# Patient Record
Sex: Female | Born: 1985 | Race: Black or African American | Hispanic: No | Marital: Single | Smoking: Former smoker
Health system: Southern US, Community
[De-identification: ages and names within clinical notes are randomized; demographics above are authoritative.]

## PROBLEM LIST (undated history)

## (undated) DIAGNOSIS — O903 Peripartum cardiomyopathy: Secondary | ICD-10-CM

## (undated) HISTORY — PX: OTHER SURGICAL HISTORY: SHX169

## (undated) HISTORY — DX: Peripartum cardiomyopathy: O90.3

---

## 2014-07-11 LAB — OB RESULTS CONSOLE HIV ANTIBODY (ROUTINE TESTING): HIV: NONREACTIVE

## 2014-07-11 LAB — OB RESULTS CONSOLE GC/CHLAMYDIA
Chlamydia: NEGATIVE
Gonorrhea: NEGATIVE

## 2014-07-11 LAB — OB RESULTS CONSOLE RPR: RPR: NONREACTIVE

## 2014-07-11 LAB — OB RESULTS CONSOLE HEPATITIS B SURFACE ANTIGEN: HEP B S AG: NEGATIVE

## 2014-07-11 LAB — OB RESULTS CONSOLE ANTIBODY SCREEN: ANTIBODY SCREEN: NEGATIVE

## 2014-07-11 LAB — OB RESULTS CONSOLE RUBELLA ANTIBODY, IGM: Rubella: IMMUNE

## 2014-07-11 LAB — OB RESULTS CONSOLE ABO/RH: RH TYPE: POSITIVE

## 2014-07-24 ENCOUNTER — Ambulatory Visit (INDEPENDENT_AMBULATORY_CARE_PROVIDER_SITE_OTHER): Payer: BLUE CROSS/BLUE SHIELD | Admitting: Cardiology

## 2014-07-24 ENCOUNTER — Encounter: Payer: Self-pay | Admitting: Cardiology

## 2014-07-24 VITALS — BP 112/60 | HR 98 | Ht 65.0 in | Wt 282.0 lb

## 2014-07-24 DIAGNOSIS — Z331 Pregnant state, incidental: Secondary | ICD-10-CM

## 2014-07-24 DIAGNOSIS — O903 Peripartum cardiomyopathy: Secondary | ICD-10-CM | POA: Diagnosis not present

## 2014-07-24 DIAGNOSIS — Z3492 Encounter for supervision of normal pregnancy, unspecified, second trimester: Secondary | ICD-10-CM

## 2014-07-24 NOTE — Patient Instructions (Signed)
Medication Instructions:  Your physician recommends that you continue on your current medications as directed. Please refer to the Current Medication list given to you today.  Labwork: none  Testing/Procedures: Your physician has requested that you have an echocardiogram. Echocardiography is a painless test that uses sound waves to create images of your heart. It provides your doctor with information about the size and shape of your heart and how well your heart's chambers and valves are working. This procedure takes approximately one hour. There are no restrictions for this procedure.  Follow-Up: As needed     

## 2014-07-24 NOTE — Progress Notes (Signed)
Cardiology Office Note   Date:  07/24/2014   ID:  Mariah Friedman, DOB 07-11-1985, MRN [id number]  PCP:  No PCP Per Patient  Cardiologist: Cassell Clement MD  No chief complaint on file.     History of Present Illness: Mariah Friedman is a 29 y.o. female who presents for cardiology evaluation.  The patient is presently [redacted] weeks pregnant with her third pregnancy.  She is seen at the request of her obstetrician Dr. Henderson Cloud.  The patient has had 2 prior pregnancies, one 5 years ago and one 3 years ago.  She has to healthy children at home.  After each of her first 2 pregnancies the patient was diagnosed with brief postpartum cardiomyopathy.  The episode which occurred within a day or 2 of her first delivery caused her to be readmitted to the hospital with combined heart failure and pneumonia.  After her second pregnancy she was treated as an outpatient with a short course of diabetics.  So for during this present pregnancy the patient has been doing well.  She denies any chest pain or shortness of breath.  She has not been having any dizziness or syncope or palpitations.  He does have a history of a known heart murmur which she states that she has had since childhood.  She has had previous echocardiograms done in [place].  These are not currently accessible. Her family history reveals that her mother is living at age 65 and has asthma.  Her father is living at age 13 and is living and well.  There is no familial history of cardiomyopathy. The patient works as a Production designer, theatre/television/film at [employer].  She has worked for [employer] for 10 years.  She works the third shift from midnight until 8 AM The patient does not use alcohol or tobacco.    Past Medical History  Diagnosis Date  . Postpartum cardiomyopathy   . Amenorrhea   . Currently pregnant     Past Surgical History  Procedure Laterality Date  . Cesarean section  2011 2013     Current Outpatient Prescriptions  Medication Sig Dispense Refill  .  Prenatal Vit-Fe Fumarate-FA (MULTIVITAMIN-PRENATAL) 27-0.8 MG TABS tablet Take 1 tablet by mouth daily at 12 noon.     No current facility-administered medications for this visit.    Allergies:   Review of patient's allergies indicates no known allergies.    Social History:  The patient  reports that she has quit smoking. She does not have any smokeless tobacco history on file.   Family History:  The patient's family history includes Anemia in her father; Asthma in her mother; Thyroid disease in her maternal grandmother.    ROS:  Please see the history of present illness.   Otherwise, review of systems are positive for none.   All other systems are reviewed and negative.    PHYSICAL EXAM: VS:  BP 112/60 mmHg  Pulse 98  Ht  (1.651 m)  Wt 282 lb (127.914 kg)  BMI 46.93 kg/m2 , BMI Body mass index is 46.93 kg/(m^2). GEN: Well nourished, well developed, in no acute distress HEENT: normal Neck: no JVD, carotid bruits, or masses Cardiac: RRR; there is a soft systolic ejection murmur at the base. Respiratory:  clear to auscultation bilaterally, normal work of breathing GI: soft, nontender, nondistended, + BS MS: no deformity or atrophy Skin: warm and dry, no rash Neuro:  Strength and sensation are intact Psych: euthymic mood, full affect   EKG:  EKG is  ordered today. The ekg ordered today demonstrates normal sinus rhythm and no ischemic changes.   Recent Labs: No results found for requested labs within last 365 days.    Lipid Panel No results found for: CHOL, TRIG, HDL, CHOLHDL, VLDL, LDLCALC, LDLDIRECT    Wt Readings from Last 3 Encounters:  07/24/14 282 lb (127.914 kg)        ASSESSMENT AND PLAN:  1.  Second trimester pregnancy.  She is due on November 28 2.  Past history of previous brief postpartum cardiomyopathy symptoms following her first and second pregnancies 5 years ago and 3 years ago. 3.  Obesity 4.  Systolic heart murmur at base  Current  medicines are reviewed at length with the patient today.  The patient does not have concerns regarding medicines.  The following changes have been made:  no change  Labs/ tests ordered today include:  Orders Placed This Encounter  Procedures  . EKG 12-Lead  . ECHOCARDIOGRAM COMPLETE   My diagnostic impression is that at the present time the patient is not exhibiting any signs or symptoms of cardiomyopathy.  She does have a soft systolic ejection murmur at the base.  We will get an echocardiogram to assess her current left ventricular systolic function and to assess her heart murmur. She is on a prenatal multivitamin.  We did not add any other medications at this time. We will be ready to see her in the early postpartum period if she redevelops signs or symptoms of postpartum cardiomyopathy this time. Many thanks for the opportunity to see this pleasant woman with you.  We'll be in touch with you regarding the results of her echocardiogram.  Signed, Cassell Clementhomas Meela Wareing MD 07/24/2014 4:57 PM    [facility] [address], [place], [place]  [address] Phone: [phone]; Fax: [phone]

## 2014-08-02 ENCOUNTER — Other Ambulatory Visit: Payer: Self-pay

## 2014-08-02 ENCOUNTER — Ambulatory Visit (HOSPITAL_COMMUNITY): Payer: BLUE CROSS/BLUE SHIELD | Attending: Internal Medicine

## 2014-08-02 DIAGNOSIS — I371 Nonrheumatic pulmonary valve insufficiency: Secondary | ICD-10-CM | POA: Diagnosis not present

## 2014-08-02 DIAGNOSIS — O903 Peripartum cardiomyopathy: Secondary | ICD-10-CM | POA: Diagnosis present

## 2014-11-02 ENCOUNTER — Inpatient Hospital Stay (HOSPITAL_COMMUNITY)
Admission: AD | Admit: 2014-11-02 | Discharge: 2014-11-02 | Disposition: A | Discharge status: Home / Self Care | Payer: Medicaid Other | Source: Ambulatory Visit | Attending: Obstetrics and Gynecology | Admitting: Obstetrics and Gynecology

## 2014-11-02 ENCOUNTER — Encounter (HOSPITAL_COMMUNITY): Payer: Self-pay | Admitting: *Deleted

## 2014-11-02 ENCOUNTER — Inpatient Hospital Stay (HOSPITAL_COMMUNITY): Payer: Medicaid Other

## 2014-11-02 DIAGNOSIS — Z3A29 29 weeks gestation of pregnancy: Secondary | ICD-10-CM | POA: Diagnosis not present

## 2014-11-02 DIAGNOSIS — O26893 Other specified pregnancy related conditions, third trimester: Secondary | ICD-10-CM | POA: Diagnosis not present

## 2014-11-02 DIAGNOSIS — Z87891 Personal history of nicotine dependence: Secondary | ICD-10-CM | POA: Insufficient documentation

## 2014-11-02 DIAGNOSIS — R0602 Shortness of breath: Secondary | ICD-10-CM | POA: Diagnosis not present

## 2014-11-02 LAB — URINALYSIS, ROUTINE W REFLEX MICROSCOPIC
Bilirubin Urine: NEGATIVE
Glucose, UA: NEGATIVE mg/dL
KETONES UR: NEGATIVE mg/dL
Leukocytes, UA: NEGATIVE
NITRITE: NEGATIVE
PH: 7 (ref 5.0–8.0)
Protein, ur: NEGATIVE mg/dL
SPECIFIC GRAVITY, URINE: 1.015 (ref 1.005–1.030)
Urobilinogen, UA: 0.2 mg/dL (ref 0.0–1.0)

## 2014-11-02 LAB — URINE MICROSCOPIC-ADD ON

## 2014-11-02 NOTE — Discharge Instructions (Signed)
Shortness of Breath °Shortness of breath means you have trouble breathing. Shortness of breath needs medical care right away. °HOME CARE  °· Do not smoke. °· Avoid being around chemicals or things (paint fumes, dust) that may bother your breathing. °· Rest as needed. Slowly begin your normal activities. °· Only take medicines as told by your doctor. °· Keep all doctor visits as told. °GET HELP RIGHT AWAY IF:  °· Your shortness of breath gets worse. °· You feel lightheaded, pass out (faint), or have a cough that is not helped by medicine. °· You cough up blood. °· You have pain with breathing. °· You have pain in your chest, arms, shoulders, or belly (abdomen). °· You have a fever. °· You cannot walk up stairs or exercise the way you normally do. °· You do not get better in the time expected. °· You have a hard time doing normal activities even with rest. °· You have problems with your medicines. °· You have any new symptoms. °MAKE SURE YOU: °· Understand these instructions. °· Will watch your condition. °· Will get help right away if you are not doing well or get worse. °Document Released: 07/23/2007 Document Revised: 02/08/2013 Document Reviewed: 04/21/2011 °ExitCare® Patient Information ©2015 [employer]. This information is not intended to replace advice given to you by your health care provider. Make sure you discuss any questions you have with your health care provider. ° °

## 2014-11-02 NOTE — MAU Provider Note (Signed)
History     CSN: [id number]  Arrival date and time: 11/02/14 1045   First Provider Initiated Contact with Patient 11/02/14 1128      No chief complaint on file.  HPI  Mariah Friedman 29 y.o. [id number] [redacted]w[redacted]d presents to the MAU from the office with SOB. She has a history of cardiomyopathy.  Past Medical History  Diagnosis Date  . Postpartum cardiomyopathy   . Amenorrhea   . Currently pregnant     Past Surgical History  Procedure Laterality Date  . Cesarean section  2011 2013    Family History  Problem Relation Age of Onset  . Asthma Mother   . Anemia Father   . Thyroid disease Maternal Grandmother     Social History  Substance Use Topics  . Smoking status: Former Games developer  . Smokeless tobacco: None  . Alcohol Use: None    Allergies: No Known Allergies  Prescriptions prior to admission  Medication Sig Dispense Refill Last Dose  . ferrous fumarate (HEMOCYTE - 106 MG FE) 325 (106 FE) MG TABS tablet Take 1 tablet by mouth.     . Prenatal Vit-Fe Fumarate-FA (MULTIVITAMIN-PRENATAL) 27-0.8 MG TABS tablet Take 1 tablet by mouth daily at 12 noon.   11/02/2014 at Unknown time    Review of Systems  Constitutional: Negative for fever.  Respiratory: Positive for shortness of breath.   Cardiovascular: Positive for leg swelling.  All other systems reviewed and are negative.  Physical Exam   Blood pressure 122/60, pulse 92, temperature 98.3 F (36.8 C), temperature source Oral, resp. rate 16, height 5\' 5"  (1.651 m), weight 132.507 kg (292 lb 2 oz), SpO2 97 %.  11/02/14 1230  --  92  --  --  122/60 mmHg  --  97 %  --  -- EM    11/02/14 1216  --  93  --  --  128/60 mmHg  --  98 %  --  -- EM   11/02/14 1145  --  103  --  --  141/70 mmHg  --  93 %  --  -- EM   11/02/14 1130  --  95  --  --  129/72 mmHg  --  95 %  --  -- EM   11/02/14 1120  --  99  --  --  --  --  99 %  --  -- EM   11/02/14 1115  --  101  --  --  130/68 mmHg  --  93 %  --  -- EM   11/02/14 1112  --  --  --   --  --  --  --  --  132.507 kg (292 lb 2 oz) EM   11/02/14 1111  --  109  --  --  118/74 mmHg  --  100 %  Room Air  -- EM   11/02/14 1107  98.3 F (36.8 C)  --  --  16  --  --  --  --  -- EM          Physical Exam  Nursing note and vitals reviewed. Constitutional: She is oriented to person, place, and time. She appears well-developed and well-nourished. No distress.  HENT:  Head: Normocephalic and atraumatic.  Cardiovascular: Normal rate.   Respiratory: Effort normal and breath sounds normal. No respiratory distress.  GI: Soft. There is no tenderness.  Musculoskeletal: Normal range of motion. She exhibits edema.  Bilateral LE  Neurological: She is alert and oriented to person, place,  and time.  Skin: Skin is warm and dry.  Psychiatric: She has a normal mood and affect. Her behavior is normal. Judgment and thought content normal.   Results for orders placed or performed during the hospital encounter of 11/02/14 (from the past 24 hour(s))  Urinalysis, Routine w reflex microscopic (not at [facility])     Status: Abnormal   Collection Time: 11/02/14 10:50 AM  Result Value Ref Range   Color, Urine YELLOW YELLOW   APPearance CLEAR CLEAR   Specific Gravity, Urine 1.015 1.005 - 1.030   pH 7.0 5.0 - 8.0   Glucose, UA NEGATIVE NEGATIVE mg/dL   Hgb urine dipstick LARGE (A) NEGATIVE   Bilirubin Urine NEGATIVE NEGATIVE   Ketones, ur NEGATIVE NEGATIVE mg/dL   Protein, ur NEGATIVE NEGATIVE mg/dL   Urobilinogen, UA 0.2 0.0 - 1.0 mg/dL   Nitrite NEGATIVE NEGATIVE   Leukocytes, UA NEGATIVE NEGATIVE  Urine microscopic-add on     Status: None   Collection Time: 11/02/14 10:50 AM  Result Value Ref Range   Squamous Epithelial / LPF RARE RARE   RBC / HPF 21-50 <3 RBC/hpf   DATA: Shortness of breath for 2 weeks. Patient is pregnant.  EXAM: CHEST 2 VIEW  COMPARISON: None.  FINDINGS: The heart size and mediastinal contours are within normal limits. Both lungs are clear. The  visualized skeletal structures are unremarkable.  IMPRESSION: No active cardiopulmonary disease.   Electronically Signed  By: Annia Belt M.D.  On: 11/02/2014 12:24  [facility] PED ROUTINE RECORD Sinus tachycardia Possible Left atrial enlargement Nonspecific T wave abnormality Abnormal ECG 109mm/s 58mm/mV  8.0 SP2 12SL 241 HD CID: [id number] Referred by: Weber Monnier Unconfirmed Vent. rate 103 BPM PR interval 156 ms QRS duration 74 ms  FHT- 150 Cat 1 No contractions Sat- 100% MAU Course  Procedures  MDM  Ekg reviewed by cardiologist on call. Reported results to Dr Henderson Cloud. Pt is currently not having any symptoms, SOB. She will be discharged to home Assessment and Plan  SOB Discharge to home  [facility] 11/02/2014, 12:45 PM

## 2014-11-02 NOTE — MAU Note (Signed)
Pt. Was sent from primary OB office due to complaints of increased swelling, SOB and feeling dizzy. Pt. [place] the beach around [place] end of [place] and noticed the swelling since then. Pt. States feels baby movement. Denies LOF or bleeding. Here for evaluation.

## 2014-11-02 NOTE — MAU Note (Signed)
Respiratory called about EKG order and will be down shortly.

## 2014-11-02 NOTE — MAU Note (Signed)
Respiratory here for EKG. Radiology paged.

## 2014-12-22 ENCOUNTER — Inpatient Hospital Stay (HOSPITAL_COMMUNITY)
Admission: AD | Admit: 2014-12-22 | Discharge: 2014-12-22 | Disposition: A | Discharge status: Home / Self Care | Payer: Medicaid Other | Source: Ambulatory Visit | Attending: Obstetrics and Gynecology | Admitting: Obstetrics and Gynecology

## 2014-12-22 ENCOUNTER — Encounter (HOSPITAL_COMMUNITY): Payer: Self-pay | Admitting: *Deleted

## 2014-12-22 DIAGNOSIS — R600 Localized edema: Secondary | ICD-10-CM | POA: Diagnosis not present

## 2014-12-22 DIAGNOSIS — O26893 Other specified pregnancy related conditions, third trimester: Secondary | ICD-10-CM | POA: Insufficient documentation

## 2014-12-22 DIAGNOSIS — Z3A36 36 weeks gestation of pregnancy: Secondary | ICD-10-CM | POA: Diagnosis not present

## 2014-12-22 DIAGNOSIS — R030 Elevated blood-pressure reading, without diagnosis of hypertension: Secondary | ICD-10-CM | POA: Diagnosis not present

## 2014-12-22 DIAGNOSIS — IMO0001 Reserved for inherently not codable concepts without codable children: Secondary | ICD-10-CM

## 2014-12-22 DIAGNOSIS — O163 Unspecified maternal hypertension, third trimester: Secondary | ICD-10-CM | POA: Diagnosis not present

## 2014-12-22 LAB — COMPREHENSIVE METABOLIC PANEL
ALBUMIN: 2.5 g/dL — AB (ref 3.5–5.0)
ALK PHOS: 106 U/L (ref 38–126)
ALT: 15 U/L (ref 14–54)
AST: 18 U/L (ref 15–41)
Anion gap: 8 (ref 5–15)
BILIRUBIN TOTAL: 0.4 mg/dL (ref 0.3–1.2)
CALCIUM: 9.2 mg/dL (ref 8.9–10.3)
CO2: 23 mmol/L (ref 22–32)
CREATININE: 0.51 mg/dL (ref 0.44–1.00)
Chloride: 102 mmol/L (ref 101–111)
GFR calc Af Amer: >60 mL/min (ref >60)
GLUCOSE: 88 mg/dL (ref 65–99)
Potassium: 4 mmol/L (ref 3.5–5.1)
Sodium: 133 mmol/L — ABNORMAL LOW (ref 135–145)
TOTAL PROTEIN: 6.4 g/dL — AB (ref 6.5–8.1)

## 2014-12-22 LAB — CBC WITH DIFFERENTIAL/PLATELET
BASOS PCT: 0 %
Basophils Absolute: 0 10*3/uL (ref 0.0–0.1)
Eosinophils Absolute: 0.1 10*3/uL (ref 0.0–0.7)
Eosinophils Relative: 1 %
HEMATOCRIT: 34.3 % — AB (ref 36.0–46.0)
HEMOGLOBIN: 10.7 g/dL — AB (ref 12.0–15.0)
LYMPHS PCT: 24 %
Lymphs Abs: 1.6 10*3/uL (ref 0.7–4.0)
MCH: 23.9 pg — ABNORMAL LOW (ref 26.0–34.0)
MCHC: 31.2 g/dL (ref 30.0–36.0)
MCV: 76.6 fL — AB (ref 78.0–100.0)
MONO ABS: 0.5 10*3/uL (ref 0.1–1.0)
MONOS PCT: 7 %
NEUTROS ABS: 4.7 10*3/uL (ref 1.7–7.7)
NEUTROS PCT: 69 %
Platelets: 251 10*3/uL (ref 150–400)
RBC: 4.48 MIL/uL (ref 3.87–5.11)
RDW: 19.3 % — ABNORMAL HIGH (ref 11.5–15.5)
WBC: 6.8 10*3/uL (ref 4.0–10.5)

## 2014-12-22 LAB — URINALYSIS, ROUTINE W REFLEX MICROSCOPIC
Bilirubin Urine: NEGATIVE
GLUCOSE, UA: NEGATIVE mg/dL
Ketones, ur: NEGATIVE mg/dL
Nitrite: NEGATIVE
PH: 6.5 (ref 5.0–8.0)
PROTEIN: NEGATIVE mg/dL
SPECIFIC GRAVITY, URINE: 1.01 (ref 1.005–1.030)
Urobilinogen, UA: 0.2 mg/dL (ref 0.0–1.0)

## 2014-12-22 LAB — PROTEIN / CREATININE RATIO, URINE
CREATININE, URINE: 63 mg/dL
Protein Creatinine Ratio: 0.11 mg/mg{Cre} (ref 0.00–0.15)
Total Protein, Urine: 7 mg/dL

## 2014-12-22 LAB — URINE MICROSCOPIC-ADD ON

## 2014-12-22 NOTE — MAU Note (Signed)
Pt. Sent from office for elevated b/p. Denies Headache or visual changes.

## 2014-12-22 NOTE — MAU Provider Note (Signed)
History     CSN: [id number]  Arrival date and time: 12/22/14 1644   None     Chief Complaint  Patient presents with  . Hypertension   HPI  Ms. Drema PryShawnqua Barbara Cowerapier is a 29 y.o. G3P2002 at 7965w4d who presents to MAU today from the office for further evaluation of slightly elevated blood pressures. The patient has a history of PP cardiomyopathy and has been cleared by [facility]. She states recent slightly elevated BPs in the office including today. She denies headache, blurred vision, abdominal pain, contractions, vaginal bleeding or LOF. She does endorse a small amount of lower extremity edema. She reports good fetal movement.   OB History    Gravida Para Term Preterm AB TAB SAB Ectopic Multiple Living   3 2 2       2       Past Medical History  Diagnosis Date  . Postpartum cardiomyopathy   . Amenorrhea   . Currently pregnant     Past Surgical History  Procedure Laterality Date  . Cesarean section  2011 2013    Family History  Problem Relation Age of Onset  . Asthma Mother   . Anemia Father   . Thyroid disease Maternal Grandmother     Social History  Substance Use Topics  . Smoking status: Former Games developermoker  . Smokeless tobacco: None  . Alcohol Use: None    Allergies: No Known Allergies  No prescriptions prior to admission    Review of Systems  Constitutional: Negative for fever and malaise/fatigue.  Eyes: Negative for blurred vision.  Cardiovascular: Negative for leg swelling.  Gastrointestinal: Negative for abdominal pain.  Neurological: Negative for headaches.   Physical Exam   Blood pressure 124/70, pulse 86.  Physical Exam  Nursing note and vitals reviewed. Constitutional: She is oriented to person, place, and time. She appears well-developed and well-nourished. No distress.  HENT:  Head: Normocephalic and atraumatic.  Cardiovascular: Normal rate.   Respiratory: Effort normal.  GI: Soft. She exhibits no distension and no mass. There is tenderness (mild  RUQ abdominal tenderness to palpation). There is no rebound and no guarding.  Musculoskeletal: She exhibits no edema.  Neurological: She is alert and oriented to person, place, and time. She has normal reflexes.  No clonus  Skin: Skin is warm and dry. No erythema.  Psychiatric: She has a normal mood and affect.   Patient Vitals for the past 24 hrs:  BP Pulse  12/22/14 1801 124/70 mmHg 86  12/22/14 1746 128/55 mmHg 86  12/22/14 1731 112/76 mmHg 101  12/22/14 1718 132/64 mmHg 88   Results for orders placed or performed during the hospital encounter of 12/22/14 (from the past 24 hour(s))  Protein / creatinine ratio, urine     Status: None   Collection Time: 12/22/14  5:00 PM  Result Value Ref Range   Creatinine, Urine 63.00 mg/dL   Total Protein, Urine 7 mg/dL   Protein Creatinine Ratio 0.11 0.00 - 0.15 mg/mg[Cre]  Urinalysis, Routine w reflex microscopic (not at [facility])     Status: Abnormal   Collection Time: 12/22/14  5:00 PM  Result Value Ref Range   Color, Urine YELLOW YELLOW   APPearance CLEAR CLEAR   Specific Gravity, Urine 1.010 1.005 - 1.030   pH 6.5 5.0 - 8.0   Glucose, UA NEGATIVE NEGATIVE mg/dL   Hgb urine dipstick SMALL (A) NEGATIVE   Bilirubin Urine NEGATIVE NEGATIVE   Ketones, ur NEGATIVE NEGATIVE mg/dL   Protein, ur NEGATIVE NEGATIVE mg/dL  Urobilinogen, UA 0.2 0.0 - 1.0 mg/dL   Nitrite NEGATIVE NEGATIVE   Leukocytes, UA TRACE (A) NEGATIVE  Urine microscopic-add on     Status: Abnormal   Collection Time: 12/22/14  5:00 PM  Result Value Ref Range   Squamous Epithelial / LPF FEW (A) RARE   WBC, UA 0-2 <3 WBC/hpf   RBC / HPF 0-2 <3 RBC/hpf  CBC with Differential/Platelet     Status: Abnormal   Collection Time: 12/22/14  5:16 PM  Result Value Ref Range   WBC 6.8 4.0 - 10.5 K/uL   RBC 4.48 3.87 - 5.11 MIL/uL   Hemoglobin 10.7 (L) 12.0 - 15.0 g/dL   HCT 16.1 (L) 09.6 - 04.5 %   MCV 76.6 (L) 78.0 - 100.0 fL   MCH 23.9 (L) 26.0 - 34.0 pg   MCHC 31.2 30.0 - 36.0  g/dL   RDW 40.9 (H) 81.1 - 91.4 %   Platelets 251 150 - 400 K/uL   Neutrophils Relative % 69 %   Neutro Abs 4.7 1.7 - 7.7 K/uL   Lymphocytes Relative 24 %   Lymphs Abs 1.6 0.7 - 4.0 K/uL   Monocytes Relative 7 %   Monocytes Absolute 0.5 0.1 - 1.0 K/uL   Eosinophils Relative 1 %   Eosinophils Absolute 0.1 0.0 - 0.7 K/uL   Basophils Relative 0 %   Basophils Absolute 0.0 0.0 - 0.1 K/uL  Comprehensive metabolic panel     Status: Abnormal   Collection Time: 12/22/14  5:16 PM  Result Value Ref Range   Sodium 133 (L) 135 - 145 mmol/L   Potassium 4.0 3.5 - 5.1 mmol/L   Chloride 102 101 - 111 mmol/L   CO2 23 22 - 32 mmol/L   Glucose, Bld 88 65 - 99 mg/dL   BUN <5 (L) 6 - 20 mg/dL   Creatinine, Ser 7.82 0.44 - 1.00 mg/dL   Calcium 9.2 8.9 - 95.6 mg/dL   Total Protein 6.4 (L) 6.5 - 8.1 g/dL   Albumin 2.5 (L) 3.5 - 5.0 g/dL   AST 18 15 - 41 U/L   ALT 15 14 - 54 U/L   Alkaline Phosphatase 106 38 - 126 U/L   Total Bilirubin 0.4 0.3 - 1.2 mg/dL   GFR calc non Af Amer >60 >60 mL/min   GFR calc Af Amer >60 >60 mL/min   Anion gap 8 5 - 15    Fetal Monitoring: Baseline: 140 bpm, moderate variability, + accelerations, no decelerations Contractions: none  MAU Course  Procedures None  MDM UA, Urine protein/creatinine ratio, CBC and CMP today Discussed with Dr. Henderson Cloud. Ok for discharge at this time with Pre-eclampsia precautions.   Assessment and Plan  A: [facility] at [redacted]w[redacted]d History of PP cardiomyopathy Slightly elevated blood pressure  P: Discharge home Labor precautions and warning signs for pre-eclampsia discussed Patient advised to follow-up with [facility] as scheduled or sooner if symptoms change or worsen Patient may return to MAU as needed or if her condition were to change or worsen  Marny Lowenstein, PA-C  12/22/2014, 7:30 PM

## 2014-12-22 NOTE — Discharge Instructions (Signed)

## 2015-01-01 ENCOUNTER — Encounter (HOSPITAL_COMMUNITY): Payer: Self-pay | Admitting: Obstetrics

## 2015-01-01 ENCOUNTER — Other Ambulatory Visit: Payer: Self-pay | Admitting: Obstetrics

## 2015-01-05 ENCOUNTER — Encounter (INDEPENDENT_AMBULATORY_CARE_PROVIDER_SITE_OTHER): Payer: Self-pay

## 2015-01-05 ENCOUNTER — Encounter (HOSPITAL_COMMUNITY): Payer: Self-pay

## 2015-01-05 ENCOUNTER — Encounter (HOSPITAL_COMMUNITY)
Admission: RE | Admit: 2015-01-05 | Discharge: 2015-01-05 | Disposition: A | Discharge status: Home / Self Care | Payer: Medicaid Other | Source: Ambulatory Visit | Attending: Obstetrics | Admitting: Obstetrics

## 2015-01-05 DIAGNOSIS — Z01818 Encounter for other preprocedural examination: Secondary | ICD-10-CM | POA: Diagnosis present

## 2015-01-05 LAB — CBC
HCT: 34.6 % — ABNORMAL LOW (ref 36.0–46.0)
HEMOGLOBIN: 10.9 g/dL — AB (ref 12.0–15.0)
MCH: 24.1 pg — AB (ref 26.0–34.0)
MCHC: 31.5 g/dL (ref 30.0–36.0)
MCV: 76.5 fL — ABNORMAL LOW (ref 78.0–100.0)
PLATELETS: 303 10*3/uL (ref 150–400)
RBC: 4.52 MIL/uL (ref 3.87–5.11)
RDW: 19.4 % — AB (ref 11.5–15.5)
WBC: 6.3 10*3/uL (ref 4.0–10.5)

## 2015-01-05 LAB — ABO/RH: ABO/RH(D): O POS

## 2015-01-05 NOTE — Anesthesia Preprocedure Evaluation (Addendum)
Anesthesia Evaluation  Patient identified by MRN, date of birth, ID band Patient awake    Reviewed: Allergy & Precautions, NPO status , Patient's Chart, lab work & pertinent test results  Airway Mallampati: II   Neck ROM: Full    Dental  (+) Loose, Dental Advisory Given,    Pulmonary former smoker,    breath sounds clear to auscultation       Cardiovascular  Rhythm:Regular  EKG 9/15 sinus tach 103, ECHO 08/02/14 EF 65%, has HX of PP cardiomyopathy, has been seen by cardiology this Pregnancy.  CM developed shortly after delivery of previous two births and required diuretic RX   Neuro/Psych negative neurological ROS  negative psych ROS   GI/Hepatic negative GI ROS, Neg liver ROS,   Endo/Other  negative endocrine ROSMorbid obesity  Renal/GU negative Renal ROS  negative genitourinary   Musculoskeletal negative musculoskeletal ROS (+)   Abdominal (+) + obese,   Peds  Hematology 10/35, plts 303   Anesthesia Other Findings   Reproductive/Obstetrics HX PP cardiomyopathy                           Anesthesia Physical Anesthesia Plan  ASA: III  Anesthesia Plan: Spinal   Post-op Pain Management:    Induction:   Airway Management Planned: Natural Airway  Additional Equipment:   Intra-op Plan:   Post-operative Plan:   Informed Consent: I have reviewed the patients History and Physical, chart, labs and discussed the procedure including the risks, benefits and alternatives for the proposed anesthesia with the patient or authorized representative who has indicated his/her understanding and acceptance.     Plan Discussed with:   Anesthesia Plan Comments: (HX PP Cardiomyopathy requiring diuretics, developed after both prior deliveries shortly after delivery.  Watch fluids, has normal ECHO this June, cardiology following patient)        Anesthesia Quick Evaluation

## 2015-01-05 NOTE — Patient Instructions (Signed)
Your procedure is scheduled on:  January 08, 2015  Enter through the Main Entrance of [facility] at: 6:00 am   Pick up the phone at the desk and dial [phone].  Call this number if you have problems the morning of surgery: [phone].  Remember: Do NOT eat food: after midnight on Sunday Do NOT drink clear liquids after: after midnight on Sunday  Take these medicines the morning of surgery with a SIP OF WATER:  None   Do NOT wear jewelry (body piercing), metal hair clips/bobby pins,or nail polish. Do NOT wear lotions, powders, or perfumes.  You may wear deoderant. Do NOT shave for 48 hours prior to surgery. Do NOT bring valuables to the hospital. Leave suitcase in car.  After surgery it may be brought to your room.  For patients admitted to the hospital, checkout time is 11:00 AM the day of discharge.

## 2015-01-06 LAB — RPR: RPR Ser Ql: NONREACTIVE

## 2015-01-07 MED ORDER — DEXTROSE 5 % IV SOLN
3.0000 g | INTRAVENOUS | Status: AC
  Administered 2015-01-08: 3 g via INTRAVENOUS
  Filled 2015-01-07: qty 3000

## 2015-01-08 ENCOUNTER — Inpatient Hospital Stay (HOSPITAL_COMMUNITY)
Admission: AD | Admit: 2015-01-08 | Discharge: 2015-01-10 | DRG: 765 | Disposition: A | Discharge status: Home / Self Care | Payer: Medicaid Other | Source: Ambulatory Visit | Attending: Obstetrics | Admitting: Obstetrics

## 2015-01-08 ENCOUNTER — Inpatient Hospital Stay (HOSPITAL_COMMUNITY): Payer: Medicaid Other | Admitting: Anesthesiology

## 2015-01-08 ENCOUNTER — Encounter (HOSPITAL_COMMUNITY): Payer: Self-pay | Admitting: *Deleted

## 2015-01-08 ENCOUNTER — Encounter (HOSPITAL_COMMUNITY)
Admission: AD | Disposition: A | Discharge status: Home / Self Care | Payer: Self-pay | Source: Ambulatory Visit | Attending: Obstetrics

## 2015-01-08 DIAGNOSIS — O4103X0 Oligohydramnios, third trimester, not applicable or unspecified: Secondary | ICD-10-CM | POA: Diagnosis present

## 2015-01-08 DIAGNOSIS — Z87891 Personal history of nicotine dependence: Secondary | ICD-10-CM | POA: Diagnosis not present

## 2015-01-08 DIAGNOSIS — O321XX0 Maternal care for breech presentation, not applicable or unspecified: Secondary | ICD-10-CM | POA: Diagnosis present

## 2015-01-08 DIAGNOSIS — Z3490 Encounter for supervision of normal pregnancy, unspecified, unspecified trimester: Secondary | ICD-10-CM

## 2015-01-08 DIAGNOSIS — O34219 Maternal care for unspecified type scar from previous cesarean delivery: Secondary | ICD-10-CM | POA: Diagnosis present

## 2015-01-08 DIAGNOSIS — Z8679 Personal history of other diseases of the circulatory system: Secondary | ICD-10-CM | POA: Diagnosis not present

## 2015-01-08 DIAGNOSIS — Z302 Encounter for sterilization: Secondary | ICD-10-CM | POA: Diagnosis not present

## 2015-01-08 DIAGNOSIS — Z3A39 39 weeks gestation of pregnancy: Secondary | ICD-10-CM | POA: Diagnosis not present

## 2015-01-08 DIAGNOSIS — Z6841 Body Mass Index (BMI) 40.0 and over, adult: Secondary | ICD-10-CM

## 2015-01-08 DIAGNOSIS — O99214 Obesity complicating childbirth: Secondary | ICD-10-CM | POA: Diagnosis present

## 2015-01-08 DIAGNOSIS — O34211 Maternal care for low transverse scar from previous cesarean delivery: Principal | ICD-10-CM | POA: Diagnosis present

## 2015-01-08 LAB — PREPARE RBC (CROSSMATCH)

## 2015-01-08 SURGERY — Surgical Case
Anesthesia: Spinal

## 2015-01-08 MED ORDER — NALBUPHINE HCL 10 MG/ML IJ SOLN: 5.0000 mg | INTRAMUSCULAR | Status: DC | PRN

## 2015-01-08 MED ORDER — FENTANYL CITRATE (PF) 100 MCG/2ML IJ SOLN
INTRAMUSCULAR | Status: AC
  Administered 2015-01-08: 50 ug via INTRAVENOUS
  Filled 2015-01-08: qty 2

## 2015-01-08 MED ORDER — OXYCODONE-ACETAMINOPHEN 5-325 MG PO TABS
1.0000 | ORAL_TABLET | ORAL | Status: DC | PRN
  Administered 2015-01-09 – 2015-01-10 (×3): 1 via ORAL
  Filled 2015-01-08 (×2): qty 1

## 2015-01-08 MED ORDER — OXYCODONE-ACETAMINOPHEN 5-325 MG PO TABS: 2.0000 | ORAL_TABLET | ORAL | Status: DC | PRN

## 2015-01-08 MED ORDER — SODIUM CHLORIDE 0.9 % IJ SOLN: 3.0000 mL | INTRAMUSCULAR | Status: DC | PRN

## 2015-01-08 MED ORDER — LACTATED RINGERS IV SOLN
INTRAVENOUS | Status: DC | PRN
  Administered 2015-01-08 (×2): via INTRAVENOUS

## 2015-01-08 MED ORDER — SCOPOLAMINE 1 MG/3DAYS TD PT72
MEDICATED_PATCH | TRANSDERMAL | Status: AC
  Administered 2015-01-08: 1.5 mg via TRANSDERMAL
  Filled 2015-01-08: qty 1

## 2015-01-08 MED ORDER — FENTANYL CITRATE (PF) 100 MCG/2ML IJ SOLN
INTRAMUSCULAR | Status: AC
  Filled 2015-01-08: qty 2

## 2015-01-08 MED ORDER — MEPERIDINE HCL 25 MG/ML IJ SOLN: 6.2500 mg | INTRAMUSCULAR | Status: DC | PRN

## 2015-01-08 MED ORDER — EPHEDRINE 5 MG/ML INJ
INTRAVENOUS | Status: AC
  Filled 2015-01-08: qty 10

## 2015-01-08 MED ORDER — SCOPOLAMINE 1 MG/3DAYS TD PT72
1.0000 | MEDICATED_PATCH | Freq: Once | TRANSDERMAL | Status: DC
  Administered 2015-01-08: 1.5 mg via TRANSDERMAL

## 2015-01-08 MED ORDER — PRENATAL MULTIVITAMIN CH
1.0000 | ORAL_TABLET | Freq: Every day | ORAL | Status: DC
  Administered 2015-01-09 – 2015-01-10 (×2): 1 via ORAL
  Filled 2015-01-08 (×2): qty 1

## 2015-01-08 MED ORDER — NALOXONE HCL 0.4 MG/ML IJ SOLN: 0.4000 mg | INTRAMUSCULAR | Status: DC | PRN

## 2015-01-08 MED ORDER — CEFAZOLIN SODIUM-DEXTROSE 2-3 GM-% IV SOLR
INTRAVENOUS | Status: AC
  Filled 2015-01-08: qty 50

## 2015-01-08 MED ORDER — PROMETHAZINE HCL 25 MG/ML IJ SOLN: 6.2500 mg | INTRAMUSCULAR | Status: DC | PRN

## 2015-01-08 MED ORDER — SIMETHICONE 80 MG PO CHEW
80.0000 mg | CHEWABLE_TABLET | Freq: Three times a day (TID) | ORAL | Status: DC
  Administered 2015-01-09 – 2015-01-10 (×4): 80 mg via ORAL
  Filled 2015-01-08 (×4): qty 1

## 2015-01-08 MED ORDER — DIPHENHYDRAMINE HCL 25 MG PO CAPS: 25.0000 mg | ORAL_CAPSULE | Freq: Four times a day (QID) | ORAL | Status: DC | PRN

## 2015-01-08 MED ORDER — OXYTOCIN 10 UNIT/ML IJ SOLN
INTRAMUSCULAR | Status: AC
  Filled 2015-01-08: qty 4

## 2015-01-08 MED ORDER — OXYTOCIN 40 UNITS IN LACTATED RINGERS INFUSION - SIMPLE MED: 62.5000 mL/h | INTRAVENOUS | Status: AC

## 2015-01-08 MED ORDER — FENTANYL CITRATE (PF) 100 MCG/2ML IJ SOLN
INTRAMUSCULAR | Status: DC | PRN
  Administered 2015-01-08 (×2): 25 ug via INTRAVENOUS
  Administered 2015-01-08: 15 ug via INTRAVENOUS
  Administered 2015-01-08: 25 ug via INTRAVENOUS

## 2015-01-08 MED ORDER — NALBUPHINE HCL 10 MG/ML IJ SOLN: 5.0000 mg | Freq: Once | INTRAMUSCULAR | Status: DC | PRN

## 2015-01-08 MED ORDER — SCOPOLAMINE 1 MG/3DAYS TD PT72: 1.0000 | MEDICATED_PATCH | Freq: Once | TRANSDERMAL | Status: DC

## 2015-01-08 MED ORDER — DIPHENHYDRAMINE HCL 25 MG PO CAPS
25.0000 mg | ORAL_CAPSULE | ORAL | Status: DC | PRN
  Filled 2015-01-08: qty 1

## 2015-01-08 MED ORDER — ONDANSETRON HCL 4 MG/2ML IJ SOLN
INTRAMUSCULAR | Status: DC | PRN
  Administered 2015-01-08: 4 mg via INTRAVENOUS

## 2015-01-08 MED ORDER — MENTHOL 3 MG MT LOZG: 1.0000 | LOZENGE | OROMUCOSAL | Status: DC | PRN

## 2015-01-08 MED ORDER — MORPHINE SULFATE (PF) 0.5 MG/ML IJ SOLN
INTRAMUSCULAR | Status: AC
  Filled 2015-01-08: qty 10

## 2015-01-08 MED ORDER — KETOROLAC TROMETHAMINE 30 MG/ML IJ SOLN
INTRAMUSCULAR | Status: AC
  Administered 2015-01-08: 30 mg via INTRAMUSCULAR
  Filled 2015-01-08: qty 1

## 2015-01-08 MED ORDER — PHENYLEPHRINE 8 MG IN D5W 100 ML (0.08MG/ML) PREMIX OPTIME
INJECTION | INTRAVENOUS | Status: AC
  Filled 2015-01-08: qty 100

## 2015-01-08 MED ORDER — LACTATED RINGERS IV SOLN
Freq: Once | INTRAVENOUS | Status: AC
  Administered 2015-01-08: 07:00:00 via INTRAVENOUS

## 2015-01-08 MED ORDER — BUPIVACAINE IN DEXTROSE 0.75-8.25 % IT SOLN
INTRATHECAL | Status: AC
  Filled 2015-01-08: qty 2

## 2015-01-08 MED ORDER — SIMETHICONE 80 MG PO CHEW
80.0000 mg | CHEWABLE_TABLET | ORAL | Status: DC
  Administered 2015-01-09 – 2015-01-10 (×2): 80 mg via ORAL
  Filled 2015-01-08 (×2): qty 1

## 2015-01-08 MED ORDER — DIBUCAINE 1 % RE OINT: 1.0000 "application " | TOPICAL_OINTMENT | RECTAL | Status: DC | PRN

## 2015-01-08 MED ORDER — WITCH HAZEL-GLYCERIN EX PADS: 1.0000 "application " | MEDICATED_PAD | CUTANEOUS | Status: DC | PRN

## 2015-01-08 MED ORDER — ONDANSETRON HCL 4 MG/2ML IJ SOLN: 4.0000 mg | Freq: Three times a day (TID) | INTRAMUSCULAR | Status: DC | PRN

## 2015-01-08 MED ORDER — DIPHENHYDRAMINE HCL 50 MG/ML IJ SOLN: 12.5000 mg | INTRAMUSCULAR | Status: DC | PRN

## 2015-01-08 MED ORDER — KETOROLAC TROMETHAMINE 30 MG/ML IJ SOLN: 30.0000 mg | Freq: Four times a day (QID) | INTRAMUSCULAR | Status: DC | PRN

## 2015-01-08 MED ORDER — PHENYLEPHRINE HCL 10 MG/ML IJ SOLN
INTRAMUSCULAR | Status: DC | PRN
Start: 2015-01-08 — End: 2015-01-08
  Administered 2015-01-08: 80 ug via INTRAVENOUS

## 2015-01-08 MED ORDER — LANOLIN HYDROUS EX OINT: 1.0000 "application " | TOPICAL_OINTMENT | CUTANEOUS | Status: DC | PRN

## 2015-01-08 MED ORDER — ACETAMINOPHEN 325 MG PO TABS: 650.0000 mg | ORAL_TABLET | ORAL | Status: DC | PRN

## 2015-01-08 MED ORDER — ONDANSETRON HCL 4 MG/2ML IJ SOLN
INTRAMUSCULAR | Status: AC
  Filled 2015-01-08: qty 2

## 2015-01-08 MED ORDER — SENNOSIDES-DOCUSATE SODIUM 8.6-50 MG PO TABS
2.0000 | ORAL_TABLET | ORAL | Status: DC
  Administered 2015-01-10: 2 via ORAL
  Filled 2015-01-08 (×2): qty 2

## 2015-01-08 MED ORDER — MORPHINE SULFATE (PF) 0.5 MG/ML IJ SOLN
INTRAMUSCULAR | Status: DC | PRN
Start: 2015-01-08 — End: 2015-01-08
  Administered 2015-01-08: .15 mg via INTRATHECAL

## 2015-01-08 MED ORDER — INFLUENZA VAC SPLIT QUAD 0.5 ML IM SUSY
0.5000 mL | PREFILLED_SYRINGE | INTRAMUSCULAR | Status: AC
  Administered 2015-01-10: 0.5 mL via INTRAMUSCULAR
  Filled 2015-01-08: qty 0.5

## 2015-01-08 MED ORDER — IBUPROFEN 600 MG PO TABS
600.0000 mg | ORAL_TABLET | Freq: Four times a day (QID) | ORAL | Status: DC
  Administered 2015-01-08 – 2015-01-10 (×8): 600 mg via ORAL
  Filled 2015-01-08 (×8): qty 1

## 2015-01-08 MED ORDER — FENTANYL CITRATE (PF) 100 MCG/2ML IJ SOLN
25.0000 ug | INTRAMUSCULAR | Status: DC | PRN
  Administered 2015-01-08: 50 ug via INTRAVENOUS

## 2015-01-08 MED ORDER — KETOROLAC TROMETHAMINE 30 MG/ML IJ SOLN
30.0000 mg | Freq: Four times a day (QID) | INTRAMUSCULAR | Status: DC | PRN
  Administered 2015-01-08: 30 mg via INTRAMUSCULAR

## 2015-01-08 MED ORDER — ACETAMINOPHEN 500 MG PO TABS
1000.0000 mg | ORAL_TABLET | Freq: Four times a day (QID) | ORAL | Status: AC
  Administered 2015-01-08 – 2015-01-09 (×3): 1000 mg via ORAL
  Filled 2015-01-08 (×3): qty 2

## 2015-01-08 MED ORDER — NALOXONE HCL 2 MG/2ML IJ SOSY
1.0000 ug/kg/h | PREFILLED_SYRINGE | INTRAMUSCULAR | Status: DC | PRN
  Filled 2015-01-08: qty 2

## 2015-01-08 MED ORDER — LACTATED RINGERS IV SOLN
INTRAVENOUS | Status: DC
  Administered 2015-01-08 (×2): via INTRAVENOUS

## 2015-01-08 MED ORDER — SIMETHICONE 80 MG PO CHEW: 80.0000 mg | CHEWABLE_TABLET | ORAL | Status: DC | PRN

## 2015-01-08 MED ORDER — OXYTOCIN 10 UNIT/ML IJ SOLN
INTRAMUSCULAR | Status: DC | PRN
  Administered 2015-01-08: 40 [IU] via INTRAMUSCULAR

## 2015-01-08 MED ORDER — BUPIVACAINE IN DEXTROSE 0.75-8.25 % IT SOLN
INTRATHECAL | Status: DC | PRN
  Administered 2015-01-08: 1.5 mL via INTRATHECAL

## 2015-01-08 MED ORDER — TETANUS-DIPHTH-ACELL PERTUSSIS 5-2.5-18.5 LF-MCG/0.5 IM SUSP: 0.5000 mL | Freq: Once | INTRAMUSCULAR | Status: DC

## 2015-01-08 MED ORDER — PHENYLEPHRINE 8 MG IN D5W 100 ML (0.08MG/ML) PREMIX OPTIME
INJECTION | INTRAVENOUS | Status: DC | PRN
  Administered 2015-01-08: 60 ug/min via INTRAVENOUS

## 2015-01-08 SURGICAL SUPPLY — 40 items
BENZOIN TINCTURE PRP APPL 2/3 (GAUZE/BANDAGES/DRESSINGS) ×3 IMPLANT
BLADE 10 SAFETY STRL DISP (BLADE) ×9 IMPLANT
CLAMP CORD UMBIL (MISCELLANEOUS) IMPLANT
CLIP FILSHIE TUBAL LIGA STRL (Clip) ×6 IMPLANT
CLOSURE WOUND 1/2 X4 (GAUZE/BANDAGES/DRESSINGS) ×1
CLOTH BEACON ORANGE TIMEOUT ST (SAFETY) ×3 IMPLANT
DRAPE SHEET LG 3/4 BI-LAMINATE (DRAPES) IMPLANT
DRSG OPSITE POSTOP 4X10 (GAUZE/BANDAGES/DRESSINGS) ×3 IMPLANT
DURAPREP 26ML APPLICATOR (WOUND CARE) ×3 IMPLANT
ELECT REM PT RETURN 9FT ADLT (ELECTROSURGICAL) ×3
ELECTRODE REM PT RTRN 9FT ADLT (ELECTROSURGICAL) ×1 IMPLANT
EXTRACTOR VACUUM KIWI (MISCELLANEOUS) IMPLANT
GLOVE BIO SURGEON STRL SZ 6 (GLOVE) ×3 IMPLANT
GLOVE BIOGEL PI IND STRL 6.5 (GLOVE) ×1 IMPLANT
GLOVE BIOGEL PI IND STRL 7.0 (GLOVE) ×1 IMPLANT
GLOVE BIOGEL PI INDICATOR 6.5 (GLOVE) ×2
GLOVE BIOGEL PI INDICATOR 7.0 (GLOVE) ×2
GOWN STRL REUS W/TWL LRG LVL3 (GOWN DISPOSABLE) ×6 IMPLANT
KIT ABG SYR 3ML LUER SLIP (SYRINGE) IMPLANT
NEEDLE HYPO 25X5/8 SAFETYGLIDE (NEEDLE) IMPLANT
NS IRRIG 1000ML POUR BTL (IV SOLUTION) ×3 IMPLANT
PACK C SECTION WH (CUSTOM PROCEDURE TRAY) ×3 IMPLANT
PAD OB MATERNITY 4.3X12.25 (PERSONAL CARE ITEMS) ×3 IMPLANT
PENCIL SMOKE EVAC W/HOLSTER (ELECTROSURGICAL) ×3 IMPLANT
RTRCTR C-SECT PINK 25CM LRG (MISCELLANEOUS) ×3 IMPLANT
STRIP CLOSURE SKIN 1/2X4 (GAUZE/BANDAGES/DRESSINGS) ×2 IMPLANT
SUT MNCRL 0 VIOLET CTX 36 (SUTURE) ×2 IMPLANT
SUT MNCRL AB 3-0 PS2 27 (SUTURE) ×3 IMPLANT
SUT MONOCRYL 0 CTX 36 (SUTURE) ×4
SUT PLAIN 0 NONE (SUTURE) IMPLANT
SUT PLAIN 2 0 (SUTURE) ×2
SUT PLAIN ABS 2-0 CT1 27XMFL (SUTURE) ×1 IMPLANT
SUT VIC AB 0 CTX 36 (SUTURE) ×4
SUT VIC AB 0 CTX36XBRD ANBCTRL (SUTURE) ×2 IMPLANT
SUT VIC AB 2-0 CT1 27 (SUTURE) ×2
SUT VIC AB 2-0 CT1 TAPERPNT 27 (SUTURE) ×1 IMPLANT
SUT VIC AB 3-0 SH 27 (SUTURE) ×2
SUT VIC AB 3-0 SH 27X BRD (SUTURE) ×1 IMPLANT
TOWEL OR 17X24 6PK STRL BLUE (TOWEL DISPOSABLE) ×3 IMPLANT
TRAY FOLEY CATH SILVER 14FR (SET/KITS/TRAYS/PACK) ×3 IMPLANT

## 2015-01-08 NOTE — Anesthesia Postprocedure Evaluation (Signed)
Anesthesia Post Note  Patient: Mariah Friedman  Procedure(s) Performed: Procedure(s) (LRB): CESAREAN SECTION WITH BILATERAL TUBAL LIGATION (N/A)  Patient location during evaluation: PACU Anesthesia Type: Spinal Level of consciousness: oriented and awake and alert Pain management: pain level controlled Vital Signs Assessment: post-procedure vital signs reviewed and stable Respiratory status: spontaneous breathing, respiratory function stable and patient connected to nasal cannula oxygen Cardiovascular status: blood pressure returned to baseline and stable Postop Assessment: No backache and No signs of nausea or vomiting Anesthetic complications: no    Last Vitals:  Filed Vitals:   01/08/15 1000 01/08/15 1015  BP: 114/62 128/65  Pulse: 74 69  Temp:    Resp: 26 20    Last Pain:  Filed Vitals:   01/08/15 1026  PainSc: 0-No pain                 Syler Norcia

## 2015-01-08 NOTE — Transfer of Care (Signed)
Immediate Anesthesia Transfer of Care Note  Patient: Mariah Friedman  Procedure(s) Performed: Procedure(s): CESAREAN SECTION WITH BILATERAL TUBAL LIGATION (N/A)  Patient Location: PACU  Anesthesia Type:Spinal  Level of Consciousness: awake, alert , oriented and patient cooperative  Airway & Oxygen Therapy: Patient Spontanous Breathing  Post-op Assessment: Report given to RN and Post -op Vital signs reviewed and stable  Post vital signs: Reviewed and stable  Last Vitals: TEMP 98.3 BP 130/54 HR 67 RR 23 POX 98  Complications: No apparent anesthesia complications

## 2015-01-08 NOTE — Progress Notes (Signed)
UR chart review completed.  

## 2015-01-08 NOTE — Lactation Note (Signed)
This note was copied from the [facility] Lactation Consultation Note  Patient Name: Mariah Friedman UEAVW'UToday's Date: 01/08/2015 Reason for consult: Initial assessment (per mom baby is in the nursery due to low temp to warm up , LC enc to cal when baby is back with her w/feeding cues )  Baby is 5 hours old and has been to the breast 4 x's - 10-25 mins with latch scores- 7-8 . Void x 1 no stool yet.  This mom is an experienced breast feeder of 9 months of her 2nd baby , not 1st baby.  LC encouraged mom to call with feeding cues and ask for lactation .  Mother informed of post-discharge support and given phone number to the lactation department, including services for phone call assistance; out-patient appointments; and breastfeeding support group. List of other breastfeeding resources in the community given in the handout. Encouraged mother to call for problems or concerns related to breastfeeding.   Maternal Data Does the patient have breastfeeding experience prior to this delivery?: Yes  Feeding Feeding Type: Breast Fed Length of feed: 10 min  LATCH Score/Interventions                      Lactation Tools Discussed/Used [facility]   Consult Status Consult Status: Follow-up Date: 01/08/15 Follow-up type: In-patient    Kathrin Greathouseorio, [name] Ann 01/08/2015, 2:17 PM

## 2015-01-08 NOTE — Op Note (Signed)
Cesarean Section Procedure Note  Pre-operative Diagnosis: 1. Intrauterine pregnancy at [redacted]w[redacted]d  2. Prior cesarean delivery x 2.  3. Desires permanent sterilization  Post-operative Diagnosis: same as above  Surgeon: Marlow Baars, MD  Assistants: Ilda Mori, MD  Procedure: Repeat low transverse cesarean section with bilateral tubal ligation  Anesthesia: Spinal anesthesia  Estimated Blood Loss: 600 mL         Drains: Foley catheter         Specimens: placenta to L&D         Implants: none         Complications:  None; patient tolerated the procedure well.         Disposition: PACU - hemodynamically stable.  Findings:  Normal uterus, tubes and ovaries bilaterally.  Small adhesions of the omentum to the anterior abdominal wall. Viable female infant, 3240g (7lb 2oz) Apgars 9, 9.    Procedure Details   The patient was counseled about the risks, benefits, complications of the cesarean section as well as the permanent nature of a tubal ligation.  She elected to proceed with a tubal ligation. Consent was obtained.  The fetus was confirmed to be breech presentation with oligohydramnios in the preoperative holding.   After spinal anesthesia was found to adequate , the patient was placed in the dorsal supine position with a leftward tilt, draped and prepped in the usual sterile manner. A Pfannenstiel incision was made and carried down through the subcutaneous tissue to the fascia.  The fascia was incised in the midline and the fascial incision was extended laterally with Mayo scissors. The superior aspect of the fascial incision was grasped with two Kocher clamp, tented up and the rectus muscles dissected off bluntly.  There were moderate adhesions in the subcutaneous layer.  The rectus muscles were separated in the midline. The abdominal peritoneum was identified and entered sharply.  There were mild adhesions of the omentum to the anterior abdominal wall that were taken down with bovie  cautery.  The incision was extended superiorly and inferiorly with good visualization of the bladder.  The Alexis retractor was deployed and the bladder flap was created sharply. Scalpel was then used to make a low transverse incision on the uterus which was extended laterally with blunt dissection. The fluid was clear. The fetal vertex was identified and brought to the hysterotomy.  The fetus was delivered atraumatically.  The cord was clamped and cut and the infant was passed to the waiting neonatologist.  Placenta was then delivered spontaneously, intact and appear normal, the uterus was cleared of all clot and debris.   The hysterotomy was repaired with #0 Monocryl in running locked fashion.    At this time, attention was turned to the left fallopian tube.  The tube was grasped with a babcock clamps and followed out to the fimbriated end.  A filshie clip was placed completely around an avascular segment of tube.  This was repeated with the right fallopian tube.  The serosal edges of the incision were oozy, and bovie cautery was used to achieve hemostasis.  The hysterotomy was reexamined and excellent hemostasis was noted.  The abdominal cavity was cleared of all clot and debris.  The Alexis retractor was removed from the abdomen. The fascia and rectus muscles were inspected and were hemostatic. The fascia was closed with 0 Vicryl in a running fashion. The subcuticular layer was irrigated and all bleeders cauterized.  The subcutaneous layer was re approximated with interrupted 3-0 plain gut.  The  skin was closed with 3-0 monocryl in a subcuticular fashion. The incision was dressed with benzoine, steri strips and pressure dressing. All sponge lap and needle counts were correct x3. Patient tolerated the procedure well and recovered in stable condition following the procedure.

## 2015-01-08 NOTE — Anesthesia Procedure Notes (Signed)
Spinal Patient location during procedure: OR Start time: 01/08/2015 8:15 AM End time: 01/08/2015 8:29 AM Staffing Anesthesiologist: Sebastian AcheMANNY, Taylon Louison Preanesthetic Checklist Completed: patient identified, site marked, surgical consent, pre-op evaluation, timeout performed, IV checked, risks and benefits discussed and monitors and equipment checked Spinal Block Patient position: sitting Prep: site prepped and draped and DuraPrep Patient monitoring: heart rate, continuous pulse ox and blood pressure Location: L3-4 Injection technique: single-shot Needle Needle type: Pencil-Tip  Needle gauge: 24 G Needle length: 10 cm Needle insertion depth: 9 cm Assessment Sensory level: T6 Additional Notes Tolerated procedure well

## 2015-01-08 NOTE — H&P (Signed)
29 y.o. [id number] @ 3378w0d presents for RCS and BTL.  Otherwise has good fetal movement and no bleeding.  Pregnancy complicated by:  1. History of postpartum cardiomyopathy with 2 prior pregnancies.  She was readmitted with heart failure with G1 and was treated with outpatient diuretics with G2. Was seen by cardiology this pregnancy and had a normal ECHO early this pregnancy (EF 60-65% and no evidence of cardiomyopathy)  Past Medical History  Diagnosis Date  . Postpartum cardiomyopathy     Past Surgical History  Procedure Laterality Date  . Cesarean section  2011 2013  . Broken ankle      OB History  Gravida Para Term Preterm AB SAB TAB Ectopic Multiple Living  3 2 2       2     # Outcome Date GA Lbr Len/2nd Weight Sex Delivery Anes PTL Lv  3 Current           2 Term           1 Term               Social History   Social History  . Marital Status: Single    Spouse Name: N/A  . Number of Children: N/A  . Years of Education: N/A   Occupational History  . Not on file.   Social History Main Topics  . Smoking status: Former Games developermoker  . Smokeless tobacco: Never Used  . Alcohol Use: 0.0 oz/week    0 Standard drinks or equivalent per week     Comment: social   . Drug Use: No  . Sexual Activity: Yes   Other Topics Concern  . Not on file   Social History Narrative   Review of patient's allergies indicates no known allergies.    Prenatal Transfer Tool  Maternal Diabetes: No Genetic Screening: Declined Maternal Ultrasounds/Referrals: Normal Fetal Ultrasounds or other Referrals:  None Maternal Substance Abuse:  No Significant Maternal Medications:  None Significant Maternal Lab Results: Lab values include: Group B Strep negative  ABO, Rh: --/--/O POS, O POS (11/18 0850) Antibody: NEG (11/18 0850) Rubella: Immune RPR: Non Reactive (11/18 0850)  HBsAg: Negative (05/24 0000)  HIV: Non-reactive (05/24 0000)  GBS:   Negative    There were no vitals filed for this visit.    General:  NAD Abdomen:  soft, gravid, obese Ex:  1 edema FHTs:  141    A/P   29 y.o. G3P2002 7978w0d presents for RCS and BTL.  Discussed risks to include risk of infection, bleeding, damage to surrounding structures (including but not limited to bowel, bladder, tubes, ovaries, nerves, vessels, baby), risk of DVT/PE, risk of blood transfusion, need for additional procedures, risk of failure of tubal ligation resulting in pregnancy, including ectopic pregnancy requiring surgical management, risk of regret.  Questions answered and consent signed Ancef 3 gm PPCM: careful fluid management in the peri-operative and postpartum period FSR  [facility] GEFF[facility] Brayden Betters

## 2015-01-09 ENCOUNTER — Encounter (HOSPITAL_COMMUNITY): Payer: Self-pay | Admitting: Obstetrics

## 2015-01-09 LAB — TYPE AND SCREEN
ABO/RH(D): O POS
ANTIBODY SCREEN: NEGATIVE
Unit division: 0
Unit division: 0

## 2015-01-09 LAB — CBC
HEMATOCRIT: 30.1 % — AB (ref 36.0–46.0)
Hemoglobin: 9.3 g/dL — ABNORMAL LOW (ref 12.0–15.0)
MCH: 23.9 pg — AB (ref 26.0–34.0)
MCHC: 30.9 g/dL (ref 30.0–36.0)
MCV: 77.4 fL — AB (ref 78.0–100.0)
PLATELETS: 238 10*3/uL (ref 150–400)
RBC: 3.89 MIL/uL (ref 3.87–5.11)
RDW: 19.7 % — AB (ref 11.5–15.5)
WBC: 6.4 10*3/uL (ref 4.0–10.5)

## 2015-01-09 LAB — BIRTH TISSUE RECOVERY COLLECTION (PLACENTA DONATION)

## 2015-01-09 NOTE — Lactation Note (Signed)
This note was copied from the [facility] Lactation Consultation Note  Patient Name: Mariah Friedman ZOXWR'UToday's Date: 01/09/2015 Reason for consult: Follow-up assessment;Other (Comment) (3% weight loss )  Baby is 34 hours old and has been consistently breast feeding 10 -25 mins. Latch score 7-8-7-8. 3 voids , 4 mec.,Bili check  @ 14 hours old.6   -  per mom breast feeding is going well and hearing increased swallows , denies sore nipples. Baby recently fed and presently sound asleep.   Maternal Data    Feeding Feeding Type:  (per mom baby recently breast fed 15 mins ) Length of feed: 15 min (per mom )  LATCH Score/Interventions                Intervention(s): Breastfeeding basics reviewed     Lactation Tools Discussed/Used     Consult Status Consult Status: Follow-up Date: 01/10/15 Follow-up type: In-patient    Kathrin Greathouseorio, Ko Bardon Ann 01/09/2015, 7:38 PM

## 2015-01-09 NOTE — Progress Notes (Signed)
Subjective: Postpartum Day 1: Cesarean Delivery Patient reports pain controlled, no nausea or vomiting. Some gas pain. Bleeding appropriate  Objective: Vital signs in last 24 hours: Temp:  [97.9 F (36.6 C)-98.4 F (36.9 C)] 97.9 F (36.6 C) (11/22 1738) Pulse Rate:  [73-98] 98 (11/22 1738) Resp:  [18-20] 20 (11/22 1738) BP: (108-140)/(46-78) 140/78 mmHg (11/22 1738) SpO2:  [97 %-99 %] 99 % (11/22 0834) Weight:  [301 lb (136.533 kg)] 301 lb (136.533 kg) (11/21 2002)  Physical Exam:  General: alert, cooperative and appears stated age 45Lochia: appropriate Uterine Fundus: firm Incision: healing well DVT Evaluation: No evidence of DVT seen on physical exam.   Recent Labs  01/09/15 0644  HGB 9.3*  HCT 30.1*    Assessment/Plan: Status post Cesarean section. Doing well postoperatively.  Continue current care.  Kele Barthelemy H. 01/09/2015, 7:47 PM

## 2015-01-10 MED ORDER — IBUPROFEN 600 MG PO TABS: 600.0000 mg | ORAL_TABLET | Freq: Four times a day (QID) | ORAL | Status: DC | PRN

## 2015-01-10 MED ORDER — DOCUSATE SODIUM 100 MG PO CAPS: 100.0000 mg | ORAL_CAPSULE | Freq: Two times a day (BID) | ORAL | Status: AC

## 2015-01-10 MED ORDER — OXYCODONE-ACETAMINOPHEN 5-325 MG PO TABS: 1.0000 | ORAL_TABLET | ORAL | Status: DC | PRN

## 2015-01-10 NOTE — Discharge Instructions (Signed)

## 2015-01-10 NOTE — Lactation Note (Addendum)
This note was copied from the chart [facility] Lactation Consultation Note  Patient Name: Mariah Friedman ZOXWR'UToday's Date: 01/10/2015 Reason for consult: Follow-up assessment Mom called for LC to observe feeding before d/c today. Mom has large pendulous breasts and LC observed that baby had difficulty sustaining good depth with latch. Tried some different positions and tried #24 nipple shield. After these attempts Baby was able to latch in football hold using breast compression and obtain good depth without the nipple shield.  Sustained a good rhythmic suck with audible swallows. Advised Mom baby should be at the breast 8-12 times in 24 hours and with feeding ques, try to keep baby nursing for 15-20 minutes both breasts with feedings when possible. Baby at 8% weight loss, 1 void past 24 hours but has voided 4 times in her life, 3 stools in 24 hours with 6 stools in life. Advised Mom to monitor voids/stools, refer to Baby N Me Booklet page 24. Mom has DEBP at home, encouraged to post pump after feedings for 15 minutes and give baby back any amount of EBM she receives. Mom BF her last child for 9 months. Advised if using bottle to use Dr. Manson PasseyBrown #1. Advised to refer to Baby N Me booklet page 25 for breast milk storage guidelines, page 24 for engorgement care. Advised of OP services and support group. Encouraged to call for questions/concerns.    Maternal Data    Feeding Feeding Type: Breast Fed Length of feed: 10 min  LATCH Score/Interventions Latch: Repeated attempts needed to sustain latch, nipple held in mouth throughout feeding, stimulation needed to elicit sucking reflex. Intervention(s): Adjust position;Assist with latch;Breast massage;Breast compression  Audible Swallowing: Spontaneous and intermittent  Type of Nipple: Everted at rest and after stimulation  Comfort (Breast/Nipple): Soft / non-tender     Hold (Positioning): Assistance needed to correctly position infant  at breast and maintain latch. Intervention(s): Breastfeeding basics reviewed;Support Pillows;Position options;Skin to skin  LATCH Score: 8  Lactation Tools Discussed/Used     Consult Status Consult Status: Complete Date: 01/10/15 Follow-up type: In-patient    Alfred LevinsGranger, Caela Huot Ann 01/10/2015, 12:09 PM

## 2015-01-10 NOTE — Discharge Summary (Signed)
Obstetric Discharge Summary Reason for Admission: cesarean section Prenatal Procedures: NST and ultrasound Intrapartum Procedures: cesarean: low cervical, transverse and tubal ligation Postpartum Procedures: none Complications-Operative and Postpartum: none HEMOGLOBIN  Date Value Ref Range Status  01/09/2015 9.3* 12.0 - 15.0 g/dL Final   HCT  Date Value Ref Range Status  01/09/2015 30.1* 36.0 - 46.0 % Final    Physical Exam:  General: alert, cooperative and appears stated age 63Lochia: appropriate Uterine Fundus: firm Incision: healing well DVT Evaluation: No evidence of DVT seen on physical exam.  Discharge Diagnoses: Term Pregnancy-delivered  Discharge Information: Date: 01/10/2015 Activity: pelvic rest Diet: routine Medications: Ibuprofen, Colace and Percocet Condition: improved Instructions: refer to practice specific booklet Discharge to: home Follow-up Information    [facility] Chestine SporeLARK[facility] In 2 weeks.   Specialty:  Obstetrics   Why:  for an incision check   Contact information:   [address] [place] [address] [phone]       Newborn Data: Live born female  Birth Weight: 7 lb 2.3 oz (3240 g) APGAR: 9, 9  Home with mother.  Mariah Xiao H. 01/10/2015, 8:14 AM

## 2015-03-31 ENCOUNTER — Encounter (HOSPITAL_COMMUNITY): Payer: Self-pay | Admitting: Emergency Medicine

## 2015-03-31 ENCOUNTER — Emergency Department (HOSPITAL_COMMUNITY)
Admission: EM | Admit: 2015-03-31 | Discharge: 2015-03-31 | Disposition: A | Discharge status: Home / Self Care | Payer: Medicaid Other | Attending: Emergency Medicine | Admitting: Emergency Medicine

## 2015-03-31 DIAGNOSIS — M5441 Lumbago with sciatica, right side: Secondary | ICD-10-CM

## 2015-03-31 DIAGNOSIS — Z87891 Personal history of nicotine dependence: Secondary | ICD-10-CM | POA: Insufficient documentation

## 2015-03-31 DIAGNOSIS — M545 Low back pain: Secondary | ICD-10-CM | POA: Diagnosis present

## 2015-03-31 DIAGNOSIS — Z79899 Other long term (current) drug therapy: Secondary | ICD-10-CM | POA: Insufficient documentation

## 2015-03-31 DIAGNOSIS — G8929 Other chronic pain: Secondary | ICD-10-CM | POA: Insufficient documentation

## 2015-03-31 MED ORDER — IBUPROFEN 600 MG PO TABS: 600.0000 mg | ORAL_TABLET | Freq: Four times a day (QID) | ORAL | Status: AC | PRN

## 2015-03-31 MED ORDER — PREDNISONE 20 MG PO TABS: 40.0000 mg | ORAL_TABLET | Freq: Every day | ORAL | Status: AC

## 2015-03-31 MED ORDER — KETOROLAC TROMETHAMINE 60 MG/2ML IM SOLN
60.0000 mg | Freq: Once | INTRAMUSCULAR | Status: DC
  Filled 2015-03-31: qty 2

## 2015-03-31 MED ORDER — PREDNISONE 20 MG PO TABS: 40.0000 mg | ORAL_TABLET | Freq: Every day | ORAL | Status: DC

## 2015-03-31 MED ORDER — MELOXICAM 7.5 MG PO TABS: 15.0000 mg | ORAL_TABLET | Freq: Every day | ORAL | Status: DC

## 2015-03-31 MED ORDER — OXYCODONE-ACETAMINOPHEN 5-325 MG PO TABS: 1.0000 | ORAL_TABLET | Freq: Four times a day (QID) | ORAL | Status: AC | PRN

## 2015-03-31 MED ORDER — OXYCODONE-ACETAMINOPHEN 5-325 MG PO TABS
2.0000 | ORAL_TABLET | Freq: Once | ORAL | Status: AC
  Administered 2015-03-31: 2 via ORAL
  Filled 2015-03-31: qty 2

## 2015-03-31 NOTE — ED Notes (Signed)
Pt reports lower back pain radiating down right leg for years; denies GU symptoms or evaluation for complaint.

## 2015-03-31 NOTE — Discharge Instructions (Signed)
Talk to OB/GYN or pediatrician prior to starting prednisone. Be sure to "pump and dump" after taking Percocet.  Sciatica Sciatica is pain, weakness, numbness, or tingling along the path of the sciatic nerve. The nerve starts in the lower back and runs down the back of each leg. The nerve controls the muscles in the lower leg and in the back of the knee, while also providing sensation to the back of the thigh, lower leg, and the sole of your foot. Sciatica is a symptom of another medical condition. For instance, nerve damage or certain conditions, such as a herniated disk or bone spur on the spine, pinch or put pressure on the sciatic nerve. This causes the pain, weakness, or other sensations normally associated with sciatica. Generally, sciatica only affects one side of the body. CAUSES   Herniated or slipped disc.  Degenerative disk disease.  A pain disorder involving the narrow muscle in the buttocks (piriformis syndrome).  Pelvic injury or fracture.  Pregnancy.  Tumor (rare). SYMPTOMS  Symptoms can vary from mild to very severe. The symptoms usually travel from the low back to the buttocks and down the back of the leg. Symptoms can include:  Mild tingling or dull aches in the lower back, leg, or hip.  Numbness in the back of the calf or sole of the foot.  Burning sensations in the lower back, leg, or hip.  Sharp pains in the lower back, leg, or hip.  Leg weakness.  Severe back pain inhibiting movement. These symptoms may get worse with coughing, sneezing, laughing, or prolonged sitting or standing. Also, being overweight may worsen symptoms. DIAGNOSIS  Your caregiver will perform a physical exam to look for common symptoms of sciatica. He or she may ask you to do certain movements or activities that would trigger sciatic nerve pain. Other tests may be performed to find the cause of the sciatica. These may include:  Blood tests.  X-rays.  Imaging tests, such as an MRI or CT  scan. TREATMENT  Treatment is directed at the cause of the sciatic pain. Sometimes, treatment is not necessary and the pain and discomfort goes away on its own. If treatment is needed, your caregiver may suggest:  Over-the-counter medicines to relieve pain.  Prescription medicines, such as anti-inflammatory medicine, muscle relaxants, or narcotics.  Applying heat or ice to the painful area.  Steroid injections to lessen pain, irritation, and inflammation around the nerve.  Reducing activity during periods of pain.  Exercising and stretching to strengthen your abdomen and improve flexibility of your spine. Your caregiver may suggest losing weight if the extra weight makes the back pain worse.  Physical therapy.  Surgery to eliminate what is pressing or pinching the nerve, such as a bone spur or part of a herniated disk. HOME CARE INSTRUCTIONS   Only take over-the-counter or prescription medicines for pain or discomfort as directed by your caregiver.  Apply ice to the affected area for 20 minutes, 3-4 times a day for the first 48-72 hours. Then try heat in the same way.  Exercise, stretch, or perform your usual activities if these do not aggravate your pain.  Attend physical therapy sessions as directed by your caregiver.  Keep all follow-up appointments as directed by your caregiver.  Do not wear high heels or shoes that do not provide proper support.  Check your mattress to see if it is too soft. A firm mattress may lessen your pain and discomfort. SEEK IMMEDIATE MEDICAL CARE IF:   You  lose control of your bowel or bladder (incontinence).  You have increasing weakness in the lower back, pelvis, buttocks, or legs.  You have redness or swelling of your back.  You have a burning sensation when you urinate.  You have pain that gets worse when you lie down or awakens you at night.  Your pain is worse than you have experienced in the past.  Your pain is lasting longer than 4  weeks.  You are suddenly losing weight without reason. MAKE SURE YOU:  Understand these instructions.  Will watch your condition.  Will get help right away if you are not doing well or get worse.   This information is not intended to replace advice given to you by your health care provider. Make sure you discuss any questions you have with your health care provider.   Document Released: 01/28/2001 Document Revised: 10/25/2014 Document Reviewed: 06/15/2011 Elsevier Interactive Patient Education [employer]

## 2015-03-31 NOTE — ED Notes (Signed)
Delay in medication due to breastfeeding question. PA will address.

## 2015-03-31 NOTE — ED Provider Notes (Signed)
CSN: [id number]     Arrival date & time 03/31/15  1744 History  By signing my name below, I, Mariah Friedman, attest that this documentation has been prepared under the direction and in the presence of [employer], PA-C. Electronically Signed: Elon Friedman ED Scribe. 03/31/2015. 8:01 PM.    Chief Complaint  Patient presents with  . Back Pain   The history is provided by the patient. No language interpreter was used.   HPI Comments: Mariah Friedman is a 30 y.o. female who presents to the Emergency Department complaining of chronic, intermittent right lower back pain radiating down the length of the posterior right leg onset 3 years ago.  The pain worsened and became constant yesterday and is generally worse with walking.  She reports treating these flares over the past several years with ibuprofen without relief, however, took a left over percocet last night with improvement.  Patient reports the pain onset 1 year after giving birth and states she had 3 epidurals "in the area" and believes this pain may be related.  She denies hx of trauma to the back, IVDU, CA, or DM.  She denies bowel/bladder incontinence, fever, dysuria.  numbness/weakness.   PCP: none  Past Medical History  Diagnosis Date  . Postpartum cardiomyopathy    Past Surgical History  Procedure Laterality Date  . Cesarean section  2011 2013  . Broken ankle    . Cesarean section with bilateral tubal ligation N/A 01/08/2015    Procedure: CESAREAN SECTION WITH BILATERAL TUBAL LIGATION;  Surgeon: Marlow Baars, MD;  Location: [facility];  Service: Obstetrics;  Laterality: N/A;   Family History  Problem Relation Age of Onset  . Asthma Mother   . Anemia Father   . Thyroid disease Maternal Grandmother    Social History  Substance Use Topics  . Smoking status: Former Games developer  . Smokeless tobacco: Never Used  . Alcohol Use: 0.0 oz/week    0 Standard drinks or equivalent per week     Comment: social    OB History    Gravida Para Term  Preterm AB TAB SAB Ectopic Multiple Living   0 1      Review of Systems  Musculoskeletal: Positive for back pain.  All other systems reviewed and are negative.   Allergies  Review of patient's allergies indicates no known allergies.  Home Medications   Prior to Admission medications   Medication Sig Start Date End Date Taking? Authorizing Provider  Prenatal Vit-Fe Fumarate-FA (MULTIVITAMIN-PRENATAL) 27-0.8 MG TABS tablet Take 1 tablet by mouth daily at 12 noon.   Yes Historical Provider, MD  docusate sodium (COLACE) 100 MG capsule Take 1 capsule (100 mg total) by mouth 2 (two) times daily. Patient not taking: Reported on 03/31/2015 01/10/15   Waynard Reeds, MD  ibuprofen (ADVIL,MOTRIN) 600 MG tablet Take 1 tablet (600 mg total) by mouth every 6 (six) hours as needed. 03/31/15   Antony Madura, PA-C  oxyCODONE-acetaminophen (ROXICET) 5-325 MG tablet Take 1-2 tablets by mouth every 6 (six) hours as needed for severe pain. 03/31/15   Antony Madura, PA-C  predniSONE (DELTASONE) 20 MG tablet Take 2 tablets (40 mg total) by mouth daily. Take 40 mg by mouth daily for 3 days, then  by mouth daily for 3 days, then  daily for 3 days 03/31/15   Antony Madura, PA-C   BP 135/69 mmHg  Pulse 64  Temp(Src) 98.1 F (36.7 C) (Oral)  Resp 16  SpO2 99%   Physical Exam  Constitutional: She is oriented to person, place, and time. She appears well-developed and well-nourished. No distress.  Nontoxic/nonseptic appearing  HENT:  Head: Normocephalic and atraumatic.  Eyes: Conjunctivae and EOM are normal. No scleral icterus.  Neck: Normal range of motion.  Cardiovascular: Normal rate, regular rhythm and intact distal pulses.   DP and PT pulses 2+ in the RLE  Pulmonary/Chest: Effort normal. No respiratory distress.  Musculoskeletal: Normal range of motion.       Thoracic back: Normal.       Lumbar back: She exhibits tenderness and pain. She exhibits normal range of motion, no swelling, no  deformity and no spasm.       Back:  No bony deformities, step-offs, or crepitus to the thoracic or lumbosacral midline. Positive straight leg raise on the right. Negative crossed straight leg raise.  Neurological: She is alert and oriented to person, place, and time. She exhibits normal muscle tone. Coordination normal.  Patellar and achilles reflexes intact. Sensation to light touch intact in BLE. Patient ambulatory with steady gait.  Skin: Skin is warm and dry. No rash noted. She is not diaphoretic. No erythema. No pallor.  Psychiatric: She has a normal mood and affect. Her behavior is normal.  Nursing note and vitals reviewed.   ED Course  Procedures (including critical care time)  DIAGNOSTIC STUDIES: Oxygen Saturation is 99% on RA, normal by my interpretation.    COORDINATION OF CARE:  8:11 PM Discussed suspicion of sciatica and plan to order pain medication.  Will prescribe steroid taper and meloxicam.  Will refer to orthopedist.  Patient may use her left over pain medication as needed.    Labs Review Labs Reviewed - No data to display  Imaging Review No results found.   I have personally reviewed and evaluated these images and lab results as part of my medical decision-making.   EKG Interpretation None      MDM   Final diagnoses:  Acute back pain with sciatica, right    Patient with back pain x 3 years, worse x 2 days. Patient neurovascularly intact and ambulatory. No hx of trauma, injury, IVDU, or cancer. No loss of bowel or bladder control. No concern for cauda equina.  Symptoms c/w sciatica. RICE protocol and pain medicine indicated and discussed with patient. Will prescribe prednisone taper, but have advised that the patient discuss this medication with her primary doctor or her child's pediatrician as she is currently breast-feeding. Have advised the patient to "pump and dump" after taking Percocet. Patient also given ibuprofen to take for pain. Return precautions  discussed and provided. Patient discharged in good condition with no unaddressed concerns.  I personally performed the services described in this documentation, which was scribed in my presence. The recorded information has been reviewed and is accurate.    Filed Vitals:   03/31/15 1805  BP: 135/69  Pulse: 64  Temp: 98.1 F (36.7 C)  TempSrc: Oral  Resp: 16  SpO2: 99%      Antony Madura, PA-C 03/31/15 2148  Lyndal Pulley, MD 04/01/15 1256

## 2016-10-04 IMAGING — CR DG CHEST 2V
2 series · 2 of 2 positions shown · non-contrast
Comparison: None.

CLINICAL DATA: Shortness of breath for 2 weeks. Patient is
pregnant.

EXAM:
CHEST  2 VIEW

[chest pa]
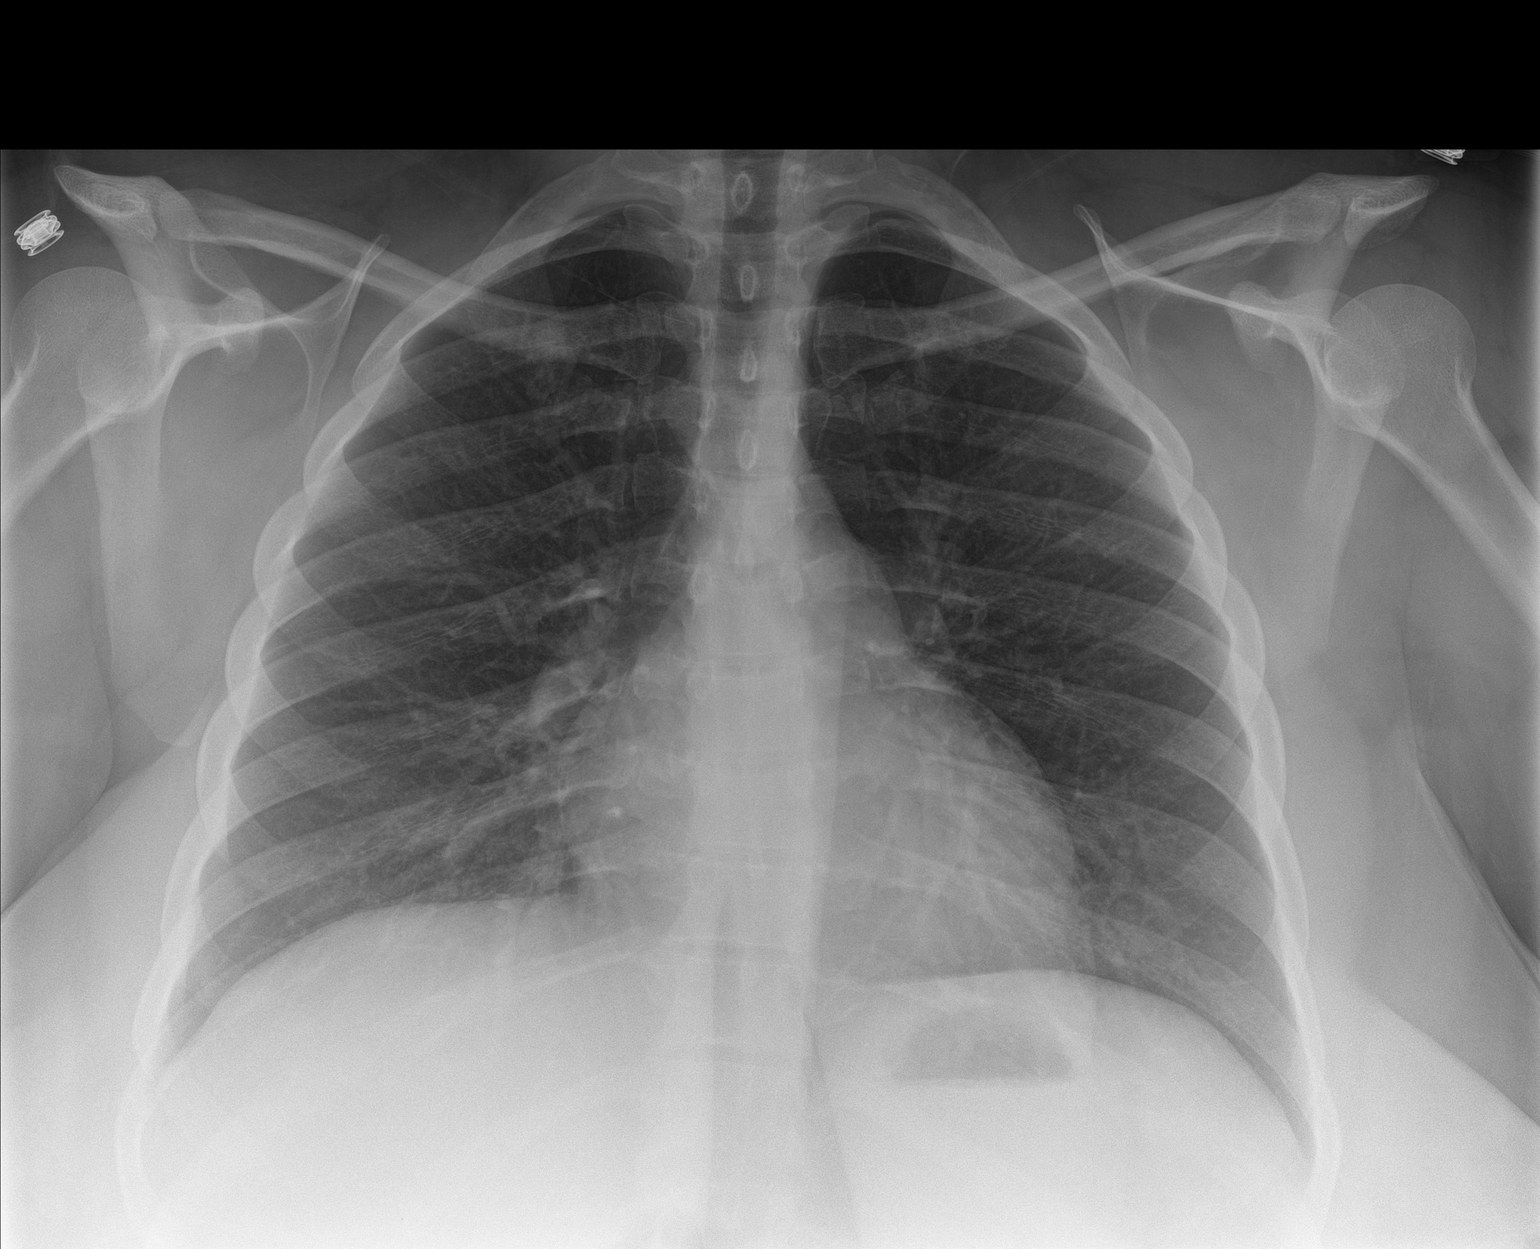

[chest lat]
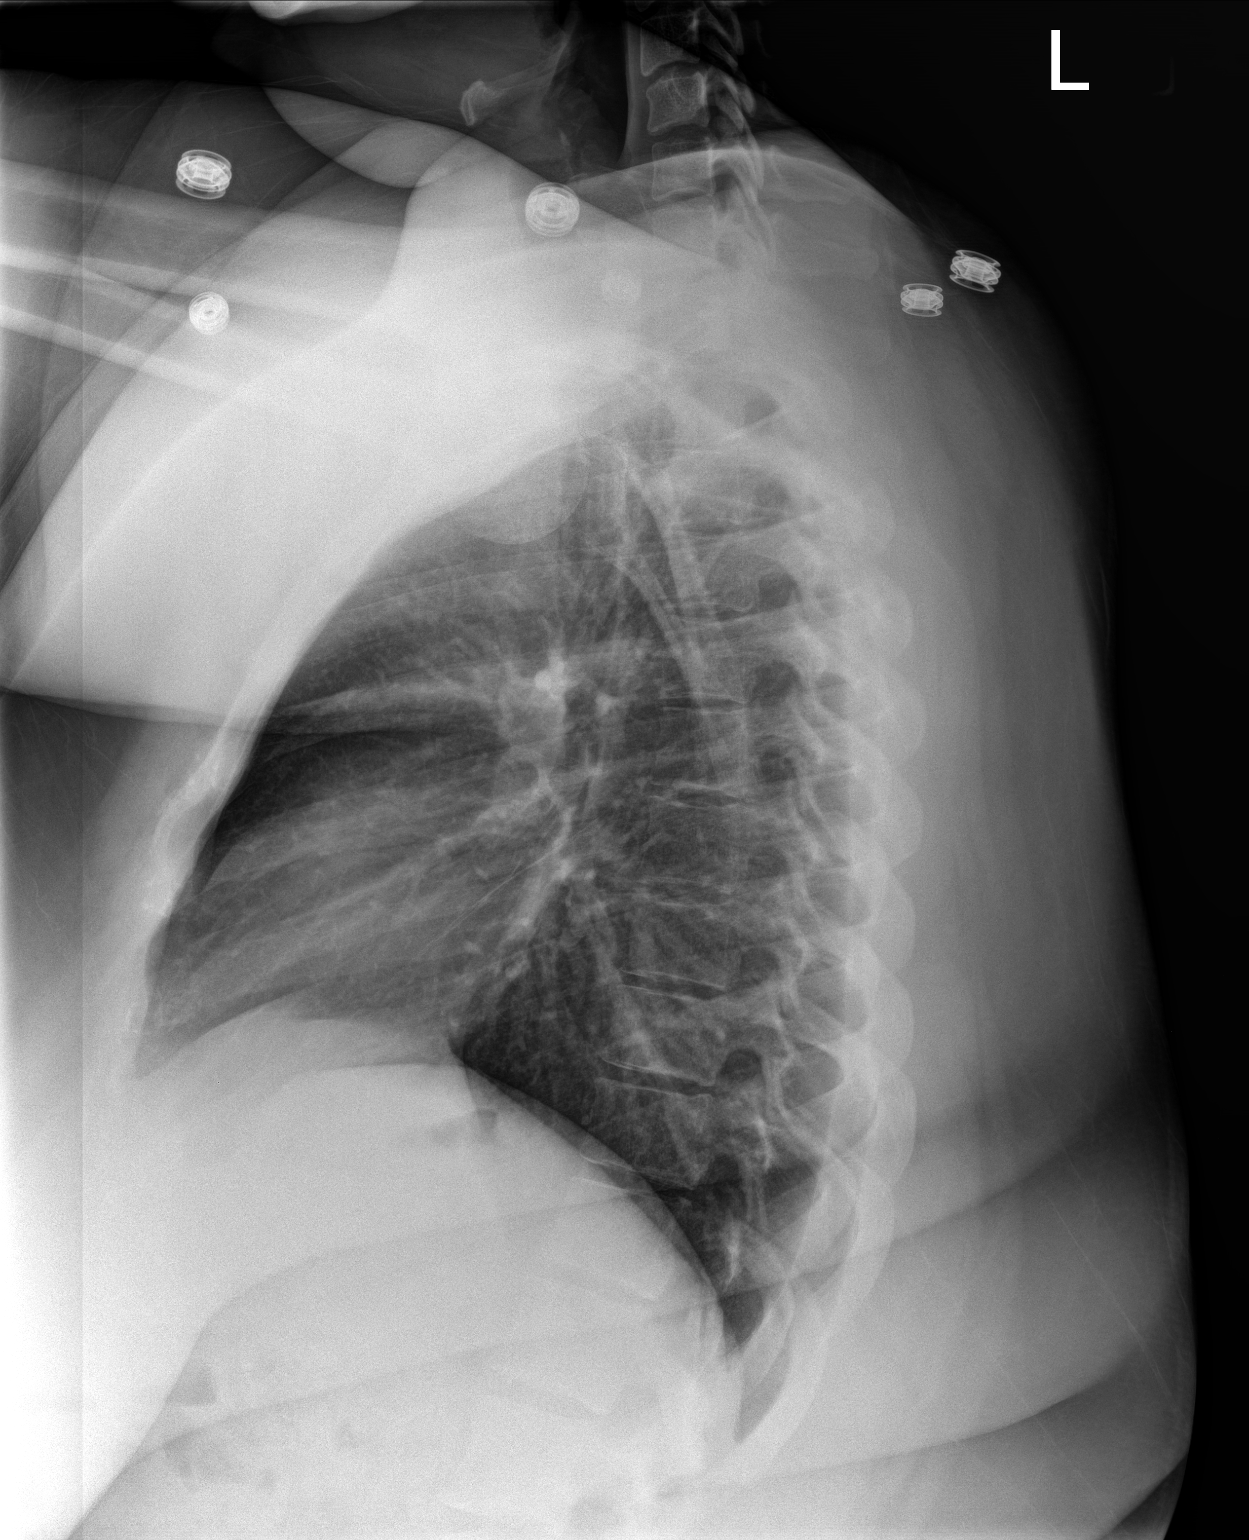

[2 of 2 positions shown; findings below may reference images not displayed]

FINDINGS: The heart size and mediastinal contours are within normal limits.
Both lungs are clear. The visualized skeletal structures are
unremarkable.
IMPRESSION: No active cardiopulmonary disease.
